# Patient Record
Sex: Male | Born: 1985 | Race: White | Hispanic: No | Marital: Single | Smoking: Never smoker
Health system: Southern US, Community
[De-identification: ages and names within clinical notes are randomized; demographics above are authoritative.]

## PROBLEM LIST (undated history)

## (undated) DIAGNOSIS — E785 Hyperlipidemia, unspecified: Secondary | ICD-10-CM

---
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
## 2010-05-23 ENCOUNTER — Emergency Department: Payer: Self-pay | Admitting: Emergency Medicine

## 2014-11-28 ENCOUNTER — Emergency Department
Admission: EM | Admit: 2014-11-28 | Discharge: 2014-11-28 | Disposition: A | Discharge status: Home / Self Care | Payer: Self-pay | Attending: Emergency Medicine | Admitting: Emergency Medicine

## 2014-11-28 DIAGNOSIS — K625 Hemorrhage of anus and rectum: Secondary | ICD-10-CM | POA: Insufficient documentation

## 2014-11-28 NOTE — ED Provider Notes (Signed)
[facility] Emergency Department Provider Note   ____________________________________________  Time seen: 7 AM I have reviewed the triage vital signs and the triage nursing note.  HISTORY  Chief Complaint Rectal Bleeding   Historian Patient  HPI Evan Dixon is a 29 y.o. male who noticed a small amount of bright red blood after and around a bowel movement just prior to arrival. This never happened before. Symptoms were mild. There is no abdominal pain or cramping. No nausea or vomiting. No black stools.No dizziness or passing out. He does not have a primary care doctor. No weight loss. No night sweats.    No past medical history on file. none  There are no active problems to display for this patient.   No past surgical history on file.  No current outpatient prescriptions on file.  Allergies Codeine  No family history on file.  Social History History  Substance Use Topics  . Smoking status: Not on file  . Smokeless tobacco: Not on file  . Alcohol Use: Not on file    Review of Systems  Constitutional: Negative for fever. Eyes: Negative for visual changes. ENT: Negative for sore throat. Cardiovascular: Negative for chest pain. Respiratory: Negative for shortness of breath. Gastrointestinal: Negative for abdominal pain, vomiting and diarrhea. Genitourinary: Negative for dysuria. Musculoskeletal: Negative for back pain. Skin: Negative for rash. Neurological: Negative for headaches, focal weakness or numbness.  ____________________________________________   PHYSICAL EXAM:  VITAL SIGNS: ED Triage Vitals  Enc Vitals Group     BP 11/28/14 0259 131/76 mmHg     Pulse Rate 11/28/14 0259 60     Resp 11/28/14 0259 20     Temp 11/28/14 0259 98 F (36.7 C)     Temp Source 11/28/14 0259 Oral     SpO2 11/28/14 0259 97 %     Weight 11/28/14 0259 175 lb (79.379 kg)     Height 11/28/14 0259 5\' 8"  (1.727 m)     Head Cir --    Peak Flow --      Pain Score 11/28/14 0552 0     Pain Loc --      Pain Edu? --      Excl. in GC? --      Constitutional: Alert and oriented. Well appearing and in no distress. Eyes: Conjunctivae are normal. PERRL. Normal extraocular movements. ENT   Head: Normocephalic and atraumatic.   Nose: No congestion/rhinnorhea.   Mouth/Throat: Mucous membranes are moist.   Neck: No stridor. Cardiovascular: Normal rate, regular rhythm.  No murmurs, rubs, or gallops. Respiratory: Normal respiratory effort without tachypnea nor retractions. Breath sounds are clear and equal bilaterally. No wheezes/rales/rhonchi. Gastrointestinal: Soft. No distention, no guarding, no rebound. Nontender  Genitourinary/rectal: Patient significantly resisted the rectal exam limiting rectal exam to the very external portion, there are no external hemorrhoids, there is a small amount of bright bloody streaks on rectal exam. Musculoskeletal: Nontender with normal range of motion in all extremities. No joint effusions.  No lower extremity tenderness nor edema. Neurologic:  Normal speech and language. No gross focal neurologic deficits are appreciated. Skin:  Skin is warm, dry and intact. No rash noted. Psychiatric: Mood and affect are normal. Speech and behavior are normal. Patient exhibits appropriate insight and judgment.  ____________________________________________   EKG  None ____________________________________________  LABS (pertinent positives/negatives)  None  ____________________________________________  RADIOLOGY Radiologist results reviewed  None __________________________________________  PROCEDURES  Procedure(s) performed: None Critical Care performed: None  ____________________________________________   ED COURSE /  ASSESSMENT AND PLAN  Pertinent labs & imaging results that were available during my care of the patient were reviewed by me and considered in my medical decision  making (see chart for details).   Patient's exam and evaluation are reassuring, I suspect minor lower GI bleeding due to a fissure or internal hemorrhoid. He does not have any symptoms suggestive of upper GI bleed, or cancer. He is being referred for primary care follow-up at [facility]. Return precautions and discharge instructions were discussed with the patient.   ___________________________________________   FINAL CLINICAL IMPRESSION(S) / ED DIAGNOSES   Final diagnoses:  Rectal bleeding      Governor Rooks, MD 11/28/14 [phone]

## 2014-11-28 NOTE — ED Notes (Signed)
Pt presents to ER and states he had a hard BM and noticed bright red blood in BM.

## 2014-11-28 NOTE — Discharge Instructions (Signed)
Your exam and evaluation are reassuring. I suspect a small amount of bright red blood around the stool was probably due to a fissure, or an internal hemorrhoid. We discussed return to the emergency department for any new or worsening wheezing, abdominal pain, vomiting blood, black stools, fever, dizziness, passing out, or any other symptoms concerning to you. You do need to follow with the primary care doctor and were referred to the [facility] clinic for primary care follow-up and reevaluation for the blood in your stools today. Call for an appointment early this coming week.  Bloody Stools Bloody stools often mean that there is a problem in the digestive tract. Your caregiver may use the term "melena" to describe black, tarry, and bad smelling stools or "hematochezia" to describe red or maroon-colored stools. Blood seen in the stool can be caused by bleeding anywhere along the intestinal tract.  A black stool usually means that blood is coming from the upper part of the gastrointestinal tract (esophagus, stomach, or small bowel). Passing maroon-colored stools or bright red blood usually means that blood is coming from lower down in the large bowel or the rectum. However, sometimes massive bleeding in the stomach or small intestine can cause bright red bloody stools.  Consuming black licorice, lead, iron pills, medicines containing bismuth subsalicylate, or blueberries can also cause black stools. Your caregiver can test black stools to see if blood is present. It is important that the cause of the bleeding be found. Treatment can then be started, and the problem can be corrected. Rectal bleeding may not be serious, but you should not assume everything is okay until you know the cause.It is very important to follow up with your caregiver or a specialist in gastrointestinal problems. CAUSES  Blood in the stools can come from various underlying causes.Often, the cause is not found during your first visit.  Testing is often needed to discover the cause of bleeding in the gastrointestinal tract. Causes range from simple to serious or even life-threatening.Possible causes include:  Hemorrhoids.These are veins that are full of blood (engorged) in the rectum. They cause pain, inflammation, and may bleed.  Anal fissures.These are areas of painful tearing which may bleed. They are often caused by passing hard stool.  Diverticulosis.These are pouches that form on the colon over time, with age, and may bleed significantly.  Diverticulitis.This is inflammation in areas with diverticulosis. It can cause pain, fever, and bloody stools, although bleeding is rare.  Proctitis and colitis. These are inflamed areas of the rectum or colon. They may cause pain, fever, and bloody stools.  Polyps and cancer. Colon cancer is a leading cause of preventable cancer death.It often starts out as precancerous polyps that can be removed during a colonoscopy, preventing progression into cancer. Sometimes, polyps and cancer may cause rectal bleeding.  Gastritis and ulcers.Bleeding from the upper gastrointestinal tract (near the stomach) may travel through the intestines and produce black, sometimes tarry, often bad smelling stools. In certain cases, if the bleeding is fast enough, the stools may not be black, but red and the condition may be life-threatening. SYMPTOMS  You may have stools that are bright red and bloody, that are normal color with blood on them, or that are dark black and tarry. In some cases, you may only have blood in the toilet bowl. Any of these cases need medical care. You may also have:  Pain at the anus or anywhere in the rectum.  Lightheadedness or feeling faint.  Extreme weakness.  Nausea  or vomiting.  Fever. DIAGNOSIS Your caregiver may use the following methods to find the cause of your bleeding:  Taking a medical history. Age is important. Older people tend to develop polyps and  cancer more often. If there is anal pain and a hard, large stool associated with bleeding, a tear of the anus may be the cause. If blood drips into the toilet after a bowel movement, bleeding hemorrhoids may be the problem. The color and frequency of the bleeding are additional considerations. In most cases, the medical history provides clues, but seldom the final answer.  A visual and finger (digital) exam. Your caregiver will inspect the anal area, looking for tears and hemorrhoids. A finger exam can provide information when there is tenderness or a growth inside. In men, the prostate is also examined.  Endoscopy. Several types of small, long scopes (endoscopes) are used to view the colon.  In the office, your caregiver may use a rigid, or more commonly, a flexible viewing sigmoidoscope. This exam is called flexible sigmoidoscopy. It is performed in 5 to 10 minutes.  A more thorough exam is accomplished with a colonoscope. It allows your caregiver to view the entire 5 to 6 foot long colon. Medicine to help you relax (sedative) is usually given for this exam. Frequently, a bleeding lesion may be present beyond the reach of the sigmoidoscope. So, a colonoscopy may be the best exam to start with. Both exams are usually done on an outpatient basis. This means the patient does not stay overnight in the hospital or surgery center.  An upper endoscopy may be needed to examine your stomach. Sedation is used and a flexible endoscope is put in your mouth, down to your stomach.  A barium enema X-ray. This is an X-ray exam. It uses liquid barium inserted by enema into the rectum. This test alone may not identify an actual bleeding point. X-rays highlight abnormal shadows, such as those made by lumps (tumors), diverticuli, or colitis. TREATMENT  Treatment depends on the cause of your bleeding.   For bleeding from the stomach or colon, the caregiver doing your endoscopy or colonoscopy may be able to stop the  bleeding as part of the procedure.  Inflammation or infection of the colon can be treated with medicines.  Many rectal problems can be treated with creams, suppositories, or warm baths.  Surgery is sometimes needed.  Blood transfusions are sometimes needed if you have lost a lot of blood.  For any bleeding problem, let your caregiver know if you take aspirin or other blood thinners regularly. HOME CARE INSTRUCTIONS   Take any medicines exactly as prescribed.  Keep your stools soft by eating a diet high in fiber. Prunes (1 to 3 a day) work well for many people.  Drink enough water and fluids to keep your urine clear or pale yellow.  Take sitz baths if advised. A sitz bath is when you sit in a bathtub with warm water for 10 to 15 minutes to soak, soothe, and cleanse the rectal area.  If enemas or suppositories are advised, be sure you know how to use them. Tell your caregiver if you have problems with this.  Monitor your bowel movements to look for signs of improvement or worsening. SEEK MEDICAL CARE IF:   You do not improve in the time expected.  Your condition worsens after initial improvement.  You develop any new symptoms. SEEK IMMEDIATE MEDICAL CARE IF:   You develop severe or prolonged rectal bleeding.  You  vomit blood.  You feel weak or faint.  You have a fever. MAKE SURE YOU:  Understand these instructions.  Will watch your condition.  Will get help right away if you are not doing well or get worse. Document Released: 05/25/2002 Document Revised: 08/27/2011 Document Reviewed: 10/20/2010 [facility] Patient Information 2015 [place], [place]. This information is not intended to replace advice given to you by your health care provider. Make sure you discuss any questions you have with your health care provider.

## 2015-04-07 ENCOUNTER — Encounter: Payer: Self-pay | Admitting: Emergency Medicine

## 2015-04-07 ENCOUNTER — Emergency Department
Admission: EM | Admit: 2015-04-07 | Discharge: 2015-04-07 | Disposition: A | Discharge status: Home / Self Care | Payer: Worker's Compensation | Attending: Emergency Medicine | Admitting: Emergency Medicine

## 2015-04-07 DIAGNOSIS — Y9389 Activity, other specified: Secondary | ICD-10-CM | POA: Insufficient documentation

## 2015-04-07 DIAGNOSIS — M25561 Pain in right knee: Secondary | ICD-10-CM

## 2015-04-07 DIAGNOSIS — Y9289 Other specified places as the place of occurrence of the external cause: Secondary | ICD-10-CM | POA: Insufficient documentation

## 2015-04-07 DIAGNOSIS — Y990 Civilian activity done for income or pay: Secondary | ICD-10-CM | POA: Diagnosis not present

## 2015-04-07 DIAGNOSIS — X58XXXA Exposure to other specified factors, initial encounter: Secondary | ICD-10-CM | POA: Diagnosis not present

## 2015-04-07 DIAGNOSIS — S8991XA Unspecified injury of right lower leg, initial encounter: Secondary | ICD-10-CM | POA: Insufficient documentation

## 2015-04-07 NOTE — ED Notes (Signed)
Pt to ed with c/o right knee pain.  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  Reports he felt a "pop" but no pain until later that night.  Pt reports increased swelling in right knee.

## 2015-04-07 NOTE — ED Provider Notes (Signed)
[facility] Emergency Department Provider Note    ____________________________________________  Time seen: 1515  I have reviewed the triage vital signs and the nursing notes.   HISTORY  Chief Complaint Knee Pain   History limited by: Not Limited   HPI Evan Dixon is a 29 y.o. male who presents to the emergency department today because of concerns for right knee pain. He states that 2 days ago he was at work moving boxes when he heard a pop in his knee. He states he did not have any sudden onset of pain. He states that later that night however he started feeling some discomfort in the knee. He describes being located underneath the proximal patella. He states the pain has then gradually gotten a little worse. He also noticed some swelling to his knee. He was able to go for a run on his knee this morning. He denies any previous injuries to that knee. Denies any numbness or tingling distally.   History reviewed. No pertinent past medical history.  There are no active problems to display for this patient.   History reviewed. No pertinent past surgical history.  No current outpatient prescriptions on file.  Allergies Codeine  History reviewed. No pertinent family history.  Social History Social History  Substance Use Topics  . Smoking status: Never Smoker   . Smokeless tobacco: None  . Alcohol Use: No    Review of Systems  Constitutional: Negative for fever. Cardiovascular: Negative for chest pain. Respiratory: Negative for shortness of breath. Gastrointestinal: Negative for abdominal pain, vomiting and diarrhea. Genitourinary: Negative for dysuria. Musculoskeletal: Negative for back pain. Positive for right knee pain. Skin: Negative for rash. Neurological: Negative for headaches, focal weakness or numbness.   10-point ROS otherwise negative.  ____________________________________________   PHYSICAL EXAM:  VITAL SIGNS: ED  Triage Vitals  Enc Vitals Group     BP 04/07/15 1443 140/83 mmHg     Pulse Rate 04/07/15 1443 60     Resp 04/07/15 1443 18     Temp 04/07/15 1443 98.5 F (36.9 C)     Temp Source 04/07/15 1443 Oral     SpO2 04/07/15 1443 100 %     Weight 04/07/15 1432 200 lb (90.719 kg)     Height 04/07/15 1432  (1.727 m)     Head Cir --      Peak Flow --      Pain Score 04/07/15 1432 4   Constitutional: Alert and oriented. Well appearing and in no distress. Eyes: Conjunctivae are normal. PERRL. Normal extraocular movements. ENT   Head: Normocephalic and atraumatic.   Nose: No congestion/rhinnorhea.   Mouth/Throat: Mucous membranes are moist.   Neck: No stridor. Cardiovascular: Normal rate, regular rhythm.   Respiratory: Normal respiratory effort without tachypnea nor retractions. Musculoskeletal: No laxity of the right knee appreciated. No effusions appreciated. No point tenderness. Patient did have some pain elicited during valgus testing. Neurovascularly intact distally. Neurologic:  Normal speech and language. No gross focal neurologic deficits are appreciated. Speech is normal.  Skin:  Skin is warm, dry and intact. No rash noted. Psychiatric: Mood and affect are normal. Speech and behavior are normal. Patient exhibits appropriate insight and judgment.  ____________________________________________    LABS (pertinent positives/negatives)  None  ____________________________________________   EKG  None  ____________________________________________    RADIOLOGY  None   ____________________________________________   PROCEDURES  Procedure(s) performed: None  Critical Care performed: No  ____________________________________________   INITIAL IMPRESSION / ASSESSMENT AND  PLAN / ED COURSE  Pertinent labs & imaging results that were available during my care of the patient were reviewed by me and considered in my medical decision making (see chart for  details).  Patient presented to the emergency department today because of concerns for right knee pain. On exam no laxity appreciated on valgus or varus testing or anterior posterior drawer test. Additionally no appreciable swelling or effusion. Neurovascularly intact distally. Only some mild tenderness during valgus testing. I do not think patient requires an x-ray at this time especially given the fact that he was able to go for a run this morning. I think patient might have a mild sprain of the knee. I will give him orthopedic follow-up. I did discuss rice with patient.  ____________________________________________   FINAL CLINICAL IMPRESSION(S) / ED DIAGNOSES  Final diagnoses:  Right knee pain     Phineas SemenGraydon Jennife Zaucha, MD 04/07/15 [phone]

## 2015-04-07 NOTE — ED Notes (Signed)
Spoke to [employer] engineer[employer] Staff, Mr. Lovell SheehanJenkins and was instructed not to perform a urine drug screen.

## 2015-04-07 NOTE — Discharge Instructions (Signed)
Apply a compressive ACE bandage. Rest and elevate the affected painful area.  Apply cold compresses intermittently as needed.  As pain recedes, begin normal activities slowly as tolerated.  Call orthopedics if symptoms persist.    Knee Pain Knee pain is a very common symptom and can have many causes. Knee pain often goes away when you follow your health care provider's instructions for relieving pain and discomfort at home. However, knee pain can develop into a condition that needs treatment. Some conditions may include:  Arthritis caused by wear and tear (osteoarthritis).  Arthritis caused by swelling and irritation (rheumatoid arthritis or gout).  A cyst or growth in your knee.  An infection in your knee joint.  An injury that will not heal.  Damage, swelling, or irritation of the tissues that support your knee (torn ligaments or tendinitis). If your knee pain continues, additional tests may be ordered to diagnose your condition. Tests may include X-rays or other imaging studies of your knee. You may also need to have fluid removed from your knee. Treatment for ongoing knee pain depends on the cause, but treatment may include:  Medicines to relieve pain or swelling.  Steroid injections in your knee.  Physical therapy.  Surgery. HOME CARE INSTRUCTIONS  Take medicines only as directed by your health care provider.  Rest your knee and keep it raised (elevated) while you are resting.  Do not do things that cause or worsen pain.  Avoid high-impact activities or exercises, such as running, jumping rope, or doing jumping jacks.  Apply ice to the knee area:  Put ice in a plastic bag.  Place a towel between your skin and the bag.  Leave the ice on for 20 minutes, 2-3 times a day.  Ask your health care provider if you should wear an elastic knee support.  Keep a pillow under your knee when you sleep.  Lose weight if you are overweight. Extra weight can put pressure on your  knee.  Do not use any tobacco products, including cigarettes, chewing tobacco, or electronic cigarettes. If you need help quitting, ask your health care provider. Smoking may slow the healing of any bone and joint problems that you may have. SEEK MEDICAL CARE IF:  Your knee pain continues, changes, or gets worse.  You have a fever along with knee pain.  Your knee buckles or locks up.  Your knee becomes more swollen. SEEK IMMEDIATE MEDICAL CARE IF:   Your knee joint feels hot to the touch.  You have chest pain or trouble breathing.   This information is not intended to replace advice given to you by your health care provider. Make sure you discuss any questions you have with your health care provider.   Document Released: 04/01/2007 Document Revised: 06/25/2014 Document Reviewed: 01/18/2014 Elsevier Interactive Patient Education [employer]2016 [employer] Inc.

## 2017-05-23 ENCOUNTER — Other Ambulatory Visit: Payer: Self-pay | Admitting: Nurse Practitioner

## 2017-05-23 DIAGNOSIS — R1011 Right upper quadrant pain: Secondary | ICD-10-CM

## 2017-06-07 ENCOUNTER — Encounter
Admission: RE | Admit: 2017-06-07 | Discharge: 2017-06-07 | Disposition: A | Discharge status: Home / Self Care | Payer: BLUE CROSS/BLUE SHIELD | Source: Ambulatory Visit | Attending: Nurse Practitioner | Admitting: Nurse Practitioner

## 2017-06-07 DIAGNOSIS — R1011 Right upper quadrant pain: Secondary | ICD-10-CM | POA: Insufficient documentation

## 2017-06-07 MED ORDER — TECHNETIUM TC 99M MEBROFENIN IV KIT
5.0000 | PACK | Freq: Once | INTRAVENOUS | Status: AC | PRN
  Administered 2017-06-07: 5.52 via INTRAVENOUS

## 2018-07-09 IMAGING — NM NM HEPATO W/GB/PHARM/[PERSON_NAME]
2 series · 12 of 12 positions shown · non-contrast
Comparison: None.

CLINICAL DATA: Right upper quadrant pain for several months. Worse
pain with fatty meal.

EXAM:
NUCLEAR MEDICINE HEPATOBILIARY IMAGING WITH GALLBLADDER EF
TECHNIQUE: Sequential images of the abdomen were obtained [DATE] minutes
following intravenous administration of radiopharmaceutical. After
oral ingestion of Ensure, gallbladder ejection fraction was
determined.
RADIOPHARMACEUTICALS:  5.52 mCi Kc-EEm Choletec IV

[Series 1000: hepatobiliary scan · 9.59mm/px · 6 of 60 frames shown]
[frame 6/60]
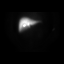
[frame 16/60]
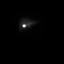
[frame 26/60]
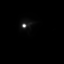
[frame 36/60]
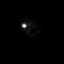
[frame 46/60]
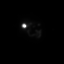
[frame 56/60]
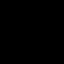

[Series 1000: gallbladder ef · 4.80mm/px · 6 of 120 frames shown]
[frame 11/120]
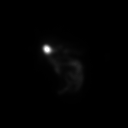
[frame 31/120]
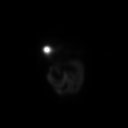
[frame 51/120]
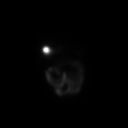
[frame 71/120]
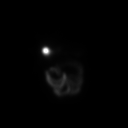
[frame 91/120]
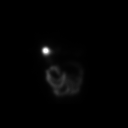
[frame 111/120]
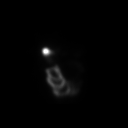

[12 of 12 positions shown; findings below may reference images not displayed]

FINDINGS: Gallbladder activity is visualized, consistent with patency of
cystic duct. Biliary activity passes into small bowel, consistent
with patent common bile duct.

Calculated gallbladder ejection fraction is 42%. (Normal gallbladder
ejection fraction with half-and-half is greater than 33%.)

The patient experienced [DATE] pain with administration of
Ensure. The study is degraded by patient motion artifact.
IMPRESSION: 1. Normal gallbladder activity and ejection fraction.
2. Mild pain with administration of Ensure.

## 2022-03-08 DIAGNOSIS — R109 Unspecified abdominal pain: Secondary | ICD-10-CM | POA: Diagnosis not present

## 2022-03-08 DIAGNOSIS — E785 Hyperlipidemia, unspecified: Secondary | ICD-10-CM | POA: Diagnosis not present

## 2022-03-08 DIAGNOSIS — R1010 Upper abdominal pain, unspecified: Secondary | ICD-10-CM | POA: Diagnosis not present

## 2022-03-08 DIAGNOSIS — R5383 Other fatigue: Secondary | ICD-10-CM | POA: Diagnosis not present

## 2022-03-08 DIAGNOSIS — R110 Nausea: Secondary | ICD-10-CM | POA: Diagnosis not present

## 2022-03-14 DIAGNOSIS — R1010 Upper abdominal pain, unspecified: Secondary | ICD-10-CM | POA: Diagnosis not present

## 2022-03-30 DIAGNOSIS — K5730 Diverticulosis of large intestine without perforation or abscess without bleeding: Secondary | ICD-10-CM | POA: Diagnosis not present

## 2022-03-30 DIAGNOSIS — E785 Hyperlipidemia, unspecified: Secondary | ICD-10-CM | POA: Diagnosis not present

## 2022-03-30 DIAGNOSIS — R109 Unspecified abdominal pain: Secondary | ICD-10-CM | POA: Diagnosis not present

## 2022-03-30 DIAGNOSIS — R110 Nausea: Secondary | ICD-10-CM | POA: Diagnosis not present

## 2022-06-04 ENCOUNTER — Ambulatory Visit: Payer: BLUE CROSS/BLUE SHIELD | Admitting: Dietician

## 2022-08-13 ENCOUNTER — Encounter: Payer: Self-pay | Admitting: Gastroenterology

## 2022-08-13 ENCOUNTER — Ambulatory Visit: Payer: 59 | Admitting: Gastroenterology

## 2022-09-16 DIAGNOSIS — R569 Unspecified convulsions: Secondary | ICD-10-CM | POA: Diagnosis not present

## 2022-09-16 DIAGNOSIS — R197 Diarrhea, unspecified: Secondary | ICD-10-CM | POA: Diagnosis not present

## 2022-09-16 DIAGNOSIS — R4182 Altered mental status, unspecified: Secondary | ICD-10-CM | POA: Diagnosis not present

## 2022-09-16 DIAGNOSIS — A419 Sepsis, unspecified organism: Secondary | ICD-10-CM | POA: Diagnosis not present

## 2022-09-16 DIAGNOSIS — R0989 Other specified symptoms and signs involving the circulatory and respiratory systems: Secondary | ICD-10-CM | POA: Diagnosis not present

## 2022-09-16 DIAGNOSIS — E785 Hyperlipidemia, unspecified: Secondary | ICD-10-CM | POA: Diagnosis not present

## 2022-09-16 DIAGNOSIS — R109 Unspecified abdominal pain: Secondary | ICD-10-CM | POA: Diagnosis not present

## 2022-09-16 DIAGNOSIS — R112 Nausea with vomiting, unspecified: Secondary | ICD-10-CM | POA: Diagnosis not present

## 2022-09-16 DIAGNOSIS — R079 Chest pain, unspecified: Secondary | ICD-10-CM | POA: Diagnosis not present

## 2022-09-17 DIAGNOSIS — E785 Hyperlipidemia, unspecified: Secondary | ICD-10-CM | POA: Diagnosis not present

## 2022-09-17 DIAGNOSIS — A0811 Acute gastroenteropathy due to Norwalk agent: Secondary | ICD-10-CM | POA: Diagnosis not present

## 2022-09-17 DIAGNOSIS — R519 Headache, unspecified: Secondary | ICD-10-CM | POA: Diagnosis not present

## 2022-09-17 DIAGNOSIS — A419 Sepsis, unspecified organism: Secondary | ICD-10-CM | POA: Diagnosis not present

## 2022-09-18 DIAGNOSIS — A0811 Acute gastroenteropathy due to Norwalk agent: Secondary | ICD-10-CM | POA: Diagnosis not present

## 2022-09-18 DIAGNOSIS — E785 Hyperlipidemia, unspecified: Secondary | ICD-10-CM | POA: Diagnosis not present

## 2022-09-18 DIAGNOSIS — R4182 Altered mental status, unspecified: Secondary | ICD-10-CM | POA: Diagnosis not present

## 2022-09-18 DIAGNOSIS — R519 Headache, unspecified: Secondary | ICD-10-CM | POA: Diagnosis not present

## 2022-09-18 DIAGNOSIS — R6510 Systemic inflammatory response syndrome (SIRS) of non-infectious origin without acute organ dysfunction: Secondary | ICD-10-CM | POA: Diagnosis not present

## 2022-10-19 DIAGNOSIS — L0390 Cellulitis, unspecified: Secondary | ICD-10-CM | POA: Diagnosis not present

## 2022-10-19 DIAGNOSIS — W57XXXA Bitten or stung by nonvenomous insect and other nonvenomous arthropods, initial encounter: Secondary | ICD-10-CM | POA: Diagnosis not present

## 2022-10-19 DIAGNOSIS — L0292 Furuncle, unspecified: Secondary | ICD-10-CM | POA: Diagnosis not present

## 2022-12-25 ENCOUNTER — Ambulatory Visit: Payer: 59 | Admitting: Internal Medicine

## 2022-12-25 VITALS — BP 110/68 | HR 76 | Ht 70.0 in | Wt 216.0 lb

## 2022-12-25 DIAGNOSIS — E669 Obesity, unspecified: Secondary | ICD-10-CM | POA: Diagnosis not present

## 2022-12-25 DIAGNOSIS — E782 Mixed hyperlipidemia: Secondary | ICD-10-CM | POA: Diagnosis not present

## 2022-12-25 DIAGNOSIS — R1011 Right upper quadrant pain: Secondary | ICD-10-CM | POA: Insufficient documentation

## 2022-12-25 MED ORDER — ATORVASTATIN CALCIUM 10 MG PO TABS: 10.0000 mg | ORAL_TABLET | Freq: Every evening | ORAL | 0 refills | Status: DC

## 2022-12-25 NOTE — Progress Notes (Signed)
Established Patient Office Visit  Subjective:  Patient ID: Evan Dixon, male    DOB: 1985-10-16  Age: 37 y.o. MRN: [id number]  Chief Complaint  Patient presents with   Follow-up    Follow Up/ ABD Pain    No new complaints, here to discuss his cholesterol and ruq pain. Was referred to GI but failed to correspond with them. Admits to dietary indiscretion since last visit and now willing to start statins.     No other concerns at this time.   No past medical history on file.  No past surgical history on file.  Social History   Socioeconomic History   Marital status: Single    Spouse name: Not on file   Number of children: Not on file   Years of education: Not on file   Highest education level: Not on file  Occupational History   Not on file  Tobacco Use   Smoking status: Never   Smokeless tobacco: Not on file  Substance and Sexual Activity   Alcohol use: No   Drug use: No   Sexual activity: Not on file  Other Topics Concern   Not on file  Social History Narrative   Not on file   Social Determinants of Health   Financial Resource Strain: Not on file  Food Insecurity: Not on file  Transportation Needs: Not on file  Physical Activity: Not on file  Stress: Not on file  Social Connections: Not on file  Intimate Partner Violence: Not on file    No family history on file.  Allergies  Allergen Reactions   Codeine Shortness Of Breath    Review of Systems  Constitutional:  Positive for weight loss.  HENT: Negative.    Eyes: Negative.   Respiratory: Negative.    Cardiovascular: Negative.   Gastrointestinal: Negative.   Genitourinary: Negative.   Skin: Negative.   Neurological: Negative.   Endo/Heme/Allergies: Negative.        Objective:   BP 110/68   Pulse 76   Ht 5\' 10"  (1.778 m)   Wt 216 lb (98 kg)   SpO2 97%   BMI 30.99 kg/m   Vitals:   12/25/22 1329  BP: 110/68  Pulse: 76  Height: 5\' 10"  (1.778 m)  Weight: 216 lb (98 kg)   SpO2: 97%  BMI (Calculated): 30.99    Physical Exam Vitals reviewed.  Constitutional:      Appearance: Normal appearance.  HENT:     Head: Normocephalic.     Left Ear: There is no impacted cerumen.     Nose: Nose normal.     Mouth/Throat:     Mouth: Mucous membranes are moist.     Pharynx: No posterior oropharyngeal erythema.  Eyes:     Extraocular Movements: Extraocular movements intact.     Pupils: Pupils are equal, round, and reactive to light.  Cardiovascular:     Rate and Rhythm: Regular rhythm.     Chest Wall: PMI is not displaced.     Pulses: Normal pulses.     Heart sounds: Normal heart sounds. No murmur heard. Pulmonary:     Effort: Pulmonary effort is normal.     Breath sounds: Normal air entry. No rhonchi or rales.  Abdominal:     General: Abdomen is flat. Bowel sounds are normal. There is no distension.     Palpations: Abdomen is soft. There is no hepatomegaly, splenomegaly or mass.     Tenderness: There is no abdominal tenderness.  Musculoskeletal:  General: Normal range of motion.     Cervical back: Normal range of motion and neck supple.     Right lower leg: No edema.     Left lower leg: No edema.  Skin:    General: Skin is warm and dry.  Neurological:     General: No focal deficit present.     Mental Status: He is alert and oriented to person, place, and time.     Cranial Nerves: No cranial nerve deficit.     Motor: No weakness.  Psychiatric:        Mood and Affect: Mood normal.        Behavior: Behavior normal.      No results found for any visits on 12/25/22.  No results found for this or any previous visit (from the past 2160 hour([name])).    Assessment & Plan:  As per problem list. Problem List Items Addressed This Visit       Other   Mixed hyperlipidemia - Primary   Relevant Medications   atorvastatin (LIPITOR) 10 MG tablet   Other Relevant Orders   Lipid panel   CK   Hepatic function panel   Continuous RUQ abdominal pain    Relevant Orders   Ambulatory referral to Gastroenterology    Return in about 3 months (around 03/27/2023) for fu with labs prior-[name].   Total time spent: 20 minutes  Luna Fuse, MD  12/25/2022   This document may have been prepared by [facility] Voice Recognition software and as such may include unintentional dictation errors.

## 2023-03-29 ENCOUNTER — Encounter: Payer: Self-pay | Admitting: Cardiology

## 2023-03-29 ENCOUNTER — Ambulatory Visit: Payer: 59 | Admitting: Cardiology

## 2023-03-29 VITALS — BP 118/80 | HR 87 | Ht 70.0 in | Wt 214.8 lb

## 2023-03-29 DIAGNOSIS — E782 Mixed hyperlipidemia: Secondary | ICD-10-CM

## 2023-03-29 DIAGNOSIS — E669 Obesity, unspecified: Secondary | ICD-10-CM | POA: Diagnosis not present

## 2023-03-29 DIAGNOSIS — Z131 Encounter for screening for diabetes mellitus: Secondary | ICD-10-CM

## 2023-03-29 MED ORDER — ATORVASTATIN CALCIUM 10 MG PO TABS
10.0000 mg | ORAL_TABLET | Freq: Every evening | ORAL | 0 refills | Status: DC
Start: 2023-03-29 — End: 2023-07-29

## 2023-03-29 NOTE — Progress Notes (Signed)
Established Patient Office Visit  Subjective:  Patient ID: Evan Dixon, male    DOB: 1985/07/17  Age: 37 y.o. MRN: [id number]  Chief Complaint  Patient presents with   Follow-up    3 mo lab results    Patient in office for 3 month follow up and to discuss blood work that he did not have done. Patient admits to not being compliant with atorvastatin. Sent in a refill. Will return in 3-4 weeks for fasting lab work. Patient reports feeling well. No complaints. States he played disc golf this morning for approximately 4 hours.     No other concerns at this time.   History reviewed. No pertinent past medical history.  History reviewed. No pertinent surgical history.  Social History   Socioeconomic History   Marital status: Single    Spouse name: Not on file   Number of children: Not on file   Years of education: Not on file   Highest education level: Not on file  Occupational History   Not on file  Tobacco Use   Smoking status: Never   Smokeless tobacco: Not on file  Substance and Sexual Activity   Alcohol use: No   Drug use: No   Sexual activity: Not on file  Other Topics Concern   Not on file  Social History Narrative   Not on file   Social Determinants of Health   Financial Resource Strain: Low Risk  (09/17/2022)   Received from [place] and [place], [place] and Affiliates   Overall [employer] (CARDIA)    Difficulty of Paying Living Expenses: Not hard at all  Food Insecurity: No Food Insecurity (09/17/2022)   Received from [place] and [place], [place] and [employer] Vital Sign    Worried About Programme researcher, broadcasting/film/video in the Last Year: Never true    Ran Out of Food in the Last Year: Never true  Transportation Needs: No Transportation Needs (09/17/2022)   Received from [place] and [place], [place] and [employer] - Administrator, Civil Service (Medical): No    Lack of Transportation (Non-Medical): No   Physical Activity: Not on file  Stress: Not on file  Social Connections: Unknown (10/27/2021)   Received from [facility], [facility]   Social Network    Social Network: Not on file  Intimate Partner Violence: Not At Risk (09/17/2022)   Received from [place] and [place], [place] and [employer], Afraid, Rape, and Kick questionnaire    Fear of Current or Ex-Partner: No    Emotionally Abused: No    Physically Abused: No    Sexually Abused: No    History reviewed. No pertinent family history.  Allergies  Allergen Reactions   Codeine Shortness Of Breath    Review of Systems  Constitutional: Negative.   HENT: Negative.    Eyes: Negative.   Respiratory: Negative.  Negative for shortness of breath.   Cardiovascular: Negative.  Negative for chest pain.  Gastrointestinal: Negative.  Negative for abdominal pain, constipation and diarrhea.  Genitourinary: Negative.   Musculoskeletal:  Negative for joint pain and myalgias.  Skin: Negative.   Neurological: Negative.  Negative for dizziness and headaches.  Endo/Heme/Allergies: Negative.   All other systems reviewed and are negative.      Objective:   BP 118/80 (BP Location: Right Arm, Patient Position: Sitting, Cuff Size: Large)   Pulse 87   Ht 5\' 10"  (1.778 m)   Wt 214 lb 12.8  oz (97.4 kg)   SpO2 98%   BMI 30.82 kg/m   Vitals:   03/29/23 1457 03/29/23 1508  BP: (!) 130/95 118/80  Pulse: 87   Height: 5\' 10"  (1.778 m)   Weight: 214 lb 12.8 oz (97.4 kg)   SpO2: 98%   BMI (Calculated): 30.82     Physical Exam Nursing note reviewed.  Constitutional:      Appearance: Normal appearance. He is normal weight.  HENT:     Head: Normocephalic and atraumatic.     Nose: Nose normal.     Mouth/Throat:     Mouth: Mucous membranes are moist.     Pharynx: Oropharynx is clear.  Eyes:     Extraocular Movements: Extraocular movements intact.     Conjunctiva/sclera: Conjunctivae normal.     Pupils: Pupils  are equal, round, and reactive to light.  Cardiovascular:     Rate and Rhythm: Normal rate and regular rhythm.     Pulses: Normal pulses.     Heart sounds: Normal heart sounds.  Pulmonary:     Effort: Pulmonary effort is normal.     Breath sounds: Normal breath sounds.  Abdominal:     General: Abdomen is flat. Bowel sounds are normal.     Palpations: Abdomen is soft.  Musculoskeletal:        General: Normal range of motion.     Cervical back: Normal range of motion.  Skin:    General: Skin is warm and dry.  Neurological:     General: No focal deficit present.     Mental Status: He is alert and oriented to person, place, and time.  Psychiatric:        Mood and Affect: Mood normal.        Behavior: Behavior normal.        Thought Content: Thought content normal.        Judgment: Judgment normal.      No results found for any visits on 03/29/23.  No results found for this or any previous visit (from the past 2160 hour(s)).    Assessment & Plan:  Return for fasting lab work.  Continue atorvastatin 10 mg daily pending lab results.   Problem List Items Addressed This Visit       Other   Mixed hyperlipidemia - Primary   Relevant Medications   atorvastatin (LIPITOR) 10 MG tablet   Other Relevant Orders   CMP14+EGFR   Obesity (BMI 30-39.9)   Other Visit Diagnoses     Diabetes mellitus screening       Relevant Orders   Hemoglobin A1c       Return in about 4 months (around 07/30/2023).   Total time spent: 25 minutes  Google, NP  03/29/2023   This document may have been prepared by Dragon Voice Recognition software and as such may include unintentional dictation errors.

## 2023-06-17 ENCOUNTER — Other Ambulatory Visit: Payer: Self-pay

## 2023-06-17 ENCOUNTER — Encounter: Payer: Self-pay | Admitting: Emergency Medicine

## 2023-06-17 ENCOUNTER — Emergency Department: Payer: 59

## 2023-06-17 ENCOUNTER — Emergency Department
Admission: EM | Admit: 2023-06-17 | Discharge: 2023-06-17 | Disposition: A | Discharge status: Home / Self Care | Payer: 59 | Attending: Emergency Medicine | Admitting: Emergency Medicine

## 2023-06-17 DIAGNOSIS — M545 Low back pain, unspecified: Secondary | ICD-10-CM | POA: Insufficient documentation

## 2023-06-17 DIAGNOSIS — R109 Unspecified abdominal pain: Secondary | ICD-10-CM | POA: Diagnosis not present

## 2023-06-17 DIAGNOSIS — M5459 Other low back pain: Secondary | ICD-10-CM | POA: Diagnosis not present

## 2023-06-17 HISTORY — DX: Hyperlipidemia, unspecified: E78.5

## 2023-06-17 LAB — URINALYSIS, ROUTINE W REFLEX MICROSCOPIC
Bilirubin Urine: NEGATIVE
Glucose, UA: NEGATIVE mg/dL
Ketones, ur: NEGATIVE mg/dL
Leukocytes,Ua: NEGATIVE
Nitrite: NEGATIVE
Protein, ur: NEGATIVE mg/dL
Specific Gravity, Urine: 1.017 (ref 1.005–1.030)
pH: 5 (ref 5.0–8.0)

## 2023-06-17 LAB — BASIC METABOLIC PANEL
Anion gap: 11 (ref 5–15)
BUN: 13 mg/dL (ref 6–20)
CO2: 22 mmol/L (ref 22–32)
Calcium: 8.9 mg/dL (ref 8.9–10.3)
Chloride: 102 mmol/L (ref 98–111)
Creatinine, Ser: 1.07 mg/dL (ref 0.61–1.24)
GFR, Estimated: >60 mL/min (ref >60)
Glucose, Bld: 113 mg/dL — ABNORMAL HIGH (ref 70–99)
Potassium: 3.8 mmol/L (ref 3.5–5.1)
Sodium: 135 mmol/L (ref 135–145)

## 2023-06-17 LAB — CBC
HCT: 45.3 % (ref 39.0–52.0)
Hemoglobin: 15.1 g/dL (ref 13.0–17.0)
MCH: 28.4 pg (ref 26.0–34.0)
MCHC: 33.3 g/dL (ref 30.0–36.0)
MCV: 85.3 fL (ref 80.0–100.0)
Platelets: 290 10*3/uL (ref 150–400)
RBC: 5.31 MIL/uL (ref 4.22–5.81)
RDW: 13.3 % (ref 11.5–15.5)
WBC: 7.2 10*3/uL (ref 4.0–10.5)
nRBC: 0 % (ref 0.0–0.2)

## 2023-06-17 MED ORDER — KETOROLAC TROMETHAMINE 30 MG/ML IJ SOLN
30.0000 mg | Freq: Once | INTRAMUSCULAR | Status: AC
  Administered 2023-06-17: 30 mg via INTRAVENOUS
  Filled 2023-06-17: qty 1

## 2023-06-17 MED ORDER — NAPROXEN 500 MG PO TABS: 500.0000 mg | ORAL_TABLET | Freq: Two times a day (BID) | ORAL | 2 refills | Status: AC

## 2023-06-17 MED ORDER — METHOCARBAMOL 500 MG PO TABS: 500.0000 mg | ORAL_TABLET | Freq: Three times a day (TID) | ORAL | 1 refills | Status: DC | PRN

## 2023-06-17 MED ORDER — TRAMADOL HCL 50 MG PO TABS: 50.0000 mg | ORAL_TABLET | Freq: Four times a day (QID) | ORAL | 0 refills | Status: AC | PRN

## 2023-06-17 MED ORDER — PREDNISONE 50 MG PO TABS: 50.0000 mg | ORAL_TABLET | Freq: Every day | ORAL | 0 refills | Status: AC

## 2023-06-17 MED ORDER — OXYCODONE-ACETAMINOPHEN 5-325 MG PO TABS: 1.0000 | ORAL_TABLET | ORAL | Status: DC | PRN

## 2023-06-17 NOTE — ED Provider Notes (Signed)
[facility] Provider Note    Event Date/Time   First MD Initiated Contact with Patient 06/17/23 [phone]     (approximate)   History   Flank Pain   HPI  Evan Dixon is a 37 y.o. male who presents with complaints of left back and left flank pain which he reports was bothering him a little bit yesterday but became more severe around 3:30 in the morning.  Denies a history of kidney stones.  Also reports some discomfort in his left back over the last several weeks per his partner.  No fevers or chills, no dysuria     Physical Exam   Triage Vital Signs: ED Triage Vitals  Encounter Vitals Group     BP 06/17/23 0435 (!) 158/104     Systolic BP Percentile --      Diastolic BP Percentile --      Pulse Rate 06/17/23 0435 62     Resp 06/17/23 0435 18     Temp 06/17/23 0435 97.7 F (36.5 C)     Temp Source 06/17/23 0435 Oral     SpO2 06/17/23 0435 98 %     Weight 06/17/23 0432 102.1 kg (225 lb)     Height 06/17/23 0432 1.778 m (5\' 10" )     Head Circumference --      Peak Flow --      Pain Score 06/17/23 0432 8     Pain Loc --      Pain Education --      Exclude from Growth Chart --     Most recent vital signs: Vitals:   06/17/23 0435 06/17/23 0856  BP: (!) 158/104 134/78  Pulse: 62 60  Resp: 18 14  Temp: 97.7 F (36.5 C)   SpO2: 98% 100%     General: Awake, no distress.  CV:  Good peripheral perfusion.  Resp:  Normal effort.  Abd:  No distention.  Soft, nontender, no CVA tenderness Other:     ED Results / Procedures / Treatments   Labs (all labs ordered are listed, but only abnormal results are displayed) Labs Reviewed  URINALYSIS, ROUTINE W REFLEX MICROSCOPIC - Abnormal; Notable for the following components:      Result Value   Color, Urine YELLOW (*)    APPearance CLEAR (*)    Hgb urine dipstick MODERATE (*)    Bacteria, UA RARE (*)    All other components within normal limits  BASIC METABOLIC PANEL - Abnormal; Notable  for the following components:   Glucose, Bld 113 (*)    All other components within normal limits  CBC     EKG     RADIOLOGY CT renal stone study viewed interpret by me, no ureterolithiasis, pending radiology confirmation    PROCEDURES:  Critical Care performed:   Procedures   MEDICATIONS ORDERED IN ED: Medications  ketorolac (TORADOL) 30 MG/ML injection 30 mg (30 mg Intravenous Given 06/17/23 0806)     IMPRESSION / MDM / ASSESSMENT AND PLAN / ED COURSE  I reviewed the triage vital signs and the nursing notes. Patient's presentation is most consistent with acute presentation with potential threat to life or bodily function.  Patient presents with back and flank pain as detailed above, differential includes ureterolithiasis, UTI, diverticulitis, musculoskeletal pain  Urinalysis does demonstrate hemoglobin, he reports this is chronic for him, will send for CT renal stone study to rule out ureterolithiasis, will treat with IV Toradol, lab work otherwise reassuring, pending CT  CT scan without evidence of ureterolithiasis  Suspect musculoskeletal pain, patient feeling much better after Toradol, will treat with NSAIDs, prednisone, outpatient follow-up patient and spouse agree with this plan      FINAL CLINICAL IMPRESSION(S) / ED DIAGNOSES   Final diagnoses:  Acute left-sided low back pain without sciatica     Rx / DC Orders   ED Discharge Orders          Ordered    traMADol (ULTRAM) 50 MG tablet  Every 6 hours PRN        06/17/23 0855    naproxen (NAPROSYN) 500 MG tablet  2 times daily with meals        06/17/23 0855    methocarbamol (ROBAXIN) 500 MG tablet  Every 8 hours PRN        06/17/23 0855    predniSONE (DELTASONE) 50 MG tablet  Daily with breakfast        06/17/23 0855             Note:  This document was prepared using Dragon voice recognition software and may include unintentional dictation errors.   Jene Every, MD 06/17/23 [phone]

## 2023-06-17 NOTE — ED Triage Notes (Signed)
Pt to ED from home c/o left flank pain starting around midnight, diarrhea x3, denies n/v, denies urinary changes.  Pain is constant with intermittent severe spasms.  Denies known injury and states does not feel like a pulled muscle.  Pt A&Ox4, chest rise even and unlabored, skin WNL and in NAD at this time.

## 2023-06-26 DIAGNOSIS — M545 Low back pain, unspecified: Secondary | ICD-10-CM | POA: Diagnosis not present

## 2023-06-26 DIAGNOSIS — S32048A Other fracture of fourth lumbar vertebra, initial encounter for closed fracture: Secondary | ICD-10-CM | POA: Diagnosis not present

## 2023-06-26 DIAGNOSIS — S239XXA Sprain of unspecified parts of thorax, initial encounter: Secondary | ICD-10-CM | POA: Diagnosis not present

## 2023-06-26 DIAGNOSIS — M5134 Other intervertebral disc degeneration, thoracic region: Secondary | ICD-10-CM | POA: Diagnosis not present

## 2023-06-26 DIAGNOSIS — M4807 Spinal stenosis, lumbosacral region: Secondary | ICD-10-CM | POA: Diagnosis not present

## 2023-06-27 ENCOUNTER — Encounter: Payer: Self-pay | Admitting: Orthopedic Surgery

## 2023-06-27 ENCOUNTER — Other Ambulatory Visit: Payer: Self-pay | Admitting: Orthopedic Surgery

## 2023-06-27 DIAGNOSIS — M4807 Spinal stenosis, lumbosacral region: Secondary | ICD-10-CM

## 2023-07-03 ENCOUNTER — Ambulatory Visit
Admission: RE | Admit: 2023-07-03 | Discharge: 2023-07-03 | Disposition: A | Discharge status: Home / Self Care | Payer: 59 | Source: Ambulatory Visit | Attending: Orthopedic Surgery | Admitting: Orthopedic Surgery

## 2023-07-03 DIAGNOSIS — M4807 Spinal stenosis, lumbosacral region: Secondary | ICD-10-CM

## 2023-07-03 DIAGNOSIS — M545 Low back pain, unspecified: Secondary | ICD-10-CM | POA: Diagnosis not present

## 2023-07-29 ENCOUNTER — Ambulatory Visit: Payer: 59 | Admitting: Cardiology

## 2023-07-29 ENCOUNTER — Encounter: Payer: Self-pay | Admitting: Cardiology

## 2023-07-29 VITALS — BP 128/76 | HR 81 | Ht 70.0 in | Wt 220.0 lb

## 2023-07-29 DIAGNOSIS — Z1329 Encounter for screening for other suspected endocrine disorder: Secondary | ICD-10-CM | POA: Diagnosis not present

## 2023-07-29 DIAGNOSIS — Z131 Encounter for screening for diabetes mellitus: Secondary | ICD-10-CM

## 2023-07-29 DIAGNOSIS — M545 Low back pain, unspecified: Secondary | ICD-10-CM | POA: Diagnosis not present

## 2023-07-29 DIAGNOSIS — E782 Mixed hyperlipidemia: Secondary | ICD-10-CM | POA: Diagnosis not present

## 2023-07-29 DIAGNOSIS — E669 Obesity, unspecified: Secondary | ICD-10-CM

## 2023-07-29 DIAGNOSIS — Z0130 Encounter for examination of blood pressure without abnormal findings: Secondary | ICD-10-CM

## 2023-07-29 MED ORDER — METHOCARBAMOL 500 MG PO TABS: 500.0000 mg | ORAL_TABLET | Freq: Three times a day (TID) | ORAL | 1 refills | Status: AC | PRN

## 2023-07-29 MED ORDER — KETOROLAC TROMETHAMINE 10 MG PO TABS
10.0000 mg | ORAL_TABLET | Freq: Four times a day (QID) | ORAL | 1 refills | Status: AC | PRN
Start: 2023-07-29 — End: ?

## 2023-07-29 MED ORDER — ATORVASTATIN CALCIUM 10 MG PO TABS: 10.0000 mg | ORAL_TABLET | Freq: Every evening | ORAL | 0 refills | Status: AC

## 2023-07-29 NOTE — Progress Notes (Signed)
 Established Patient Office Visit  Subjective:  Patient ID: Evan Dixon, male    DOB: 26-Oct-1985  Age: 38 y.o. MRN: [id number]  Chief Complaint  Patient presents with   Follow-up    4 Month Follow Up    Patient in office for 4 month follow up. Patient reports feeling well. Complains of ongoing back pain. Recent MRI unremarkable. Will refer to PT.  Fasting lab work not done. Will return when fasting.     No other concerns at this time.   Past Medical History:  Diagnosis Date   Hyperlipemia     History reviewed. No pertinent surgical history.  Social History   Socioeconomic History   Marital status: Single    Spouse name: Not on file   Number of children: Not on file   Years of education: Not on file   Highest education level: Not on file  Occupational History   Not on file  Tobacco Use   Smoking status: Never   Smokeless tobacco: Not on file  Substance and Sexual Activity   Alcohol use: No   Drug use: No   Sexual activity: Not on file  Other Topics Concern   Not on file  Social History Narrative   Not on file   Social Drivers of Health   Financial Resource Strain: Low Risk  (09/17/2022)   Received from [place] and [place], [place] and Affiliates   Overall [employer] (CARDIA)    Difficulty of Paying Living Expenses: Not hard at all  Food Insecurity: No Food Insecurity (09/17/2022)   Received from [place] and [place], [place] and [employer] Vital Sign    Worried About Programme researcher, broadcasting/film/video in the Last Year: Never true    Ran Out of Food in the Last Year: Never true  Transportation Needs: No Transportation Needs (09/17/2022)   Received from [place] and [place], [place] and [employer] - Administrator, Civil Service (Medical): No    Lack of Transportation (Non-Medical): No  Physical Activity: Not on file  Stress: Not on file  Social Connections: Unknown (10/27/2021)   Received from [facility],  [facility]   Social Network    Social Network: Not on file  Intimate Partner Violence: Not At Risk (09/17/2022)   Received from [place] and [place], [place] and [employer], Afraid, Rape, and Kick questionnaire    Fear of Current or Ex-Partner: No    Emotionally Abused: No    Physically Abused: No    Sexually Abused: No    History reviewed. No pertinent family history.  Allergies  Allergen Reactions   Codeine Shortness Of Breath    Outpatient Medications Prior to Visit  Medication Sig   naproxen  (NAPROSYN ) 500 MG tablet Take 1 tablet (500 mg total) by mouth 2 (two) times daily with a meal.   traMADol  (ULTRAM ) 50 MG tablet Take 1 tablet (50 mg total) by mouth every 6 (six) hours as needed.   [DISCONTINUED] atorvastatin  (LIPITOR) 10 MG tablet Take 1 tablet (10 mg total) by mouth at bedtime.   [DISCONTINUED] methocarbamol  (ROBAXIN ) 500 MG tablet Take 1 tablet (500 mg total) by mouth every 8 (eight) hours as needed for muscle spasms.   No facility-administered medications prior to visit.    Review of Systems  Constitutional: Negative.   HENT: Negative.    Eyes: Negative.   Respiratory: Negative.  Negative for shortness of breath.   Cardiovascular: Negative.  Negative for chest  pain.  Gastrointestinal: Negative.  Negative for abdominal pain, constipation and diarrhea.  Genitourinary: Negative.   Musculoskeletal:  Positive for back pain. Negative for joint pain and myalgias.  Skin: Negative.   Neurological: Negative.  Negative for dizziness and headaches.  Endo/Heme/Allergies: Negative.   All other systems reviewed and are negative.      Objective:   BP 128/76   Pulse 81   Ht 5\' 10"  (1.778 m)   Wt 220 lb (99.8 kg)   SpO2 96%   BMI 31.57 kg/m   Vitals:   07/29/23 1416  BP: 128/76  Pulse: 81  Height: 5\' 10"  (1.778 m)  Weight: 220 lb (99.8 kg)  SpO2: 96%  BMI (Calculated): 31.57    Physical Exam Nursing note reviewed.  Constitutional:       Appearance: Normal appearance. He is normal weight.  HENT:     Head: Normocephalic and atraumatic.     Nose: Nose normal.     Mouth/Throat:     Mouth: Mucous membranes are moist.     Pharynx: Oropharynx is clear.  Eyes:     Extraocular Movements: Extraocular movements intact.     Conjunctiva/sclera: Conjunctivae normal.     Pupils: Pupils are equal, round, and reactive to light.  Cardiovascular:     Rate and Rhythm: Normal rate and regular rhythm.     Pulses: Normal pulses.     Heart sounds: Normal heart sounds.  Pulmonary:     Effort: Pulmonary effort is normal.     Breath sounds: Normal breath sounds.  Abdominal:     General: Abdomen is flat. Bowel sounds are normal.     Palpations: Abdomen is soft.  Musculoskeletal:        General: Normal range of motion.     Cervical back: Normal range of motion.  Skin:    General: Skin is warm and dry.  Neurological:     General: No focal deficit present.     Mental Status: He is alert and oriented to person, place, and time.  Psychiatric:        Mood and Affect: Mood normal.        Behavior: Behavior normal.        Thought Content: Thought content normal.        Judgment: Judgment normal.      No results found for any visits on 07/29/23.  Recent Results (from the past 2160 hours)  Urinalysis, Routine w reflex microscopic -Urine, Clean Catch     Status: Abnormal   Collection Time: 06/17/23  4:37 AM  Result Value Ref Range   Color, Urine YELLOW (A) YELLOW   APPearance CLEAR (A) CLEAR   Specific Gravity, Urine 1.017 1.005 - 1.030   pH 5.0 5.0 - 8.0   Glucose, UA NEGATIVE NEGATIVE mg/dL   Hgb urine dipstick MODERATE (A) NEGATIVE   Bilirubin Urine NEGATIVE NEGATIVE   Ketones, ur NEGATIVE NEGATIVE mg/dL   Protein, ur NEGATIVE NEGATIVE mg/dL   Nitrite NEGATIVE NEGATIVE   Leukocytes,Ua NEGATIVE NEGATIVE   RBC / HPF 0-5 0 - 5 RBC/hpf   WBC, UA 0-5 0 - 5 WBC/hpf   Bacteria, UA RARE (A) NONE SEEN   Squamous Epithelial / HPF 0-5 0  - 5 /HPF   Mucus PRESENT     Comment: Performed at [facility], [address], [place], [place] [address]  Basic metabolic panel     Status: Abnormal   Collection Time: 06/17/23  4:37 AM  Result Value  Ref Range   Sodium 135 135 - 145 mmol/L   Potassium 3.8 3.5 - 5.1 mmol/L   Chloride 102 98 - 111 mmol/L   CO2 22 22 - 32 mmol/L   Glucose, Bld 113 (H) 70 - 99 mg/dL    Comment: Glucose reference range applies only to samples taken after fasting for at least 8 hours.   BUN 13 6 - 20 mg/dL   Creatinine, Ser 1.61 0.61 - 1.24 mg/dL   Calcium  8.9 8.9 - 10.3 mg/dL   GFR, Estimated >09 >60 mL/min    Comment: (NOTE) Calculated using the CKD-EPI Creatinine Equation (2021)    Anion gap 11 5 - 15    Comment: Performed at [facility], [address] [place], [place] [address]  CBC     Status: None   Collection Time: 06/17/23  4:37 AM  Result Value Ref Range   WBC 7.2 4.0 - 10.5 K/uL   RBC 5.31 4.22 - 5.81 MIL/uL   Hemoglobin 15.1 13.0 - 17.0 g/dL   HCT 81.1 91.4 - 78.2 %   MCV 85.3 80.0 - 100.0 fL   MCH 28.4 26.0 - 34.0 pg   MCHC 33.3 30.0 - 36.0 g/dL   RDW 95.6 21.3 - 08.6 %   Platelets 290 150 - 400 K/uL   nRBC 0.0 0.0 - 0.2 %    Comment: Performed at [facility], [address], [place], [place] [address]      Assessment & Plan:  Return for fasting lab work. Referral sent to physical therapy.   Problem List Items Addressed This Visit       Other   Mixed hyperlipidemia - Primary   Relevant Medications   atorvastatin  (LIPITOR) 10 MG tablet   Other Relevant Orders   Lipid Profile   Obesity (BMI 30-39.9)   Other Visit Diagnoses       Diabetes mellitus screening       Relevant Orders   Hemoglobin A1c   CMP14+EGFR     Thyroid  disorder screening       Relevant Orders   TSH     Acute low back pain without sciatica, unspecified back pain laterality       Relevant Medications   methocarbamol  (ROBAXIN ) 500 MG tablet   ketorolac  (TORADOL ) 10  MG tablet   Other Relevant Orders   Ambulatory referral to Physical Therapy       Return in about 4 months (around 11/26/2023).   Total time spent: 25 minutes  [place], NP  07/29/2023   This document may have been prepared by Dragon Voice Recognition software and as such may include unintentional dictation errors.

## 2023-08-20 DIAGNOSIS — Z6832 Body mass index (BMI) 32.0-32.9, adult: Secondary | ICD-10-CM | POA: Diagnosis not present

## 2023-08-20 DIAGNOSIS — E669 Obesity, unspecified: Secondary | ICD-10-CM | POA: Diagnosis not present

## 2023-08-20 DIAGNOSIS — E785 Hyperlipidemia, unspecified: Secondary | ICD-10-CM | POA: Diagnosis not present

## 2023-08-26 DIAGNOSIS — M5186 Other intervertebral disc disorders, lumbar region: Secondary | ICD-10-CM | POA: Diagnosis not present

## 2023-09-02 DIAGNOSIS — M5186 Other intervertebral disc disorders, lumbar region: Secondary | ICD-10-CM | POA: Diagnosis not present

## 2023-11-25 ENCOUNTER — Ambulatory Visit: Payer: 59 | Admitting: Cardiology

## 2023-12-02 ENCOUNTER — Ambulatory Visit: Admitting: Cardiology

## 2023-12-09 ENCOUNTER — Ambulatory Visit: Admitting: Cardiology

## 2024-02-21 DIAGNOSIS — R0789 Other chest pain: Secondary | ICD-10-CM | POA: Diagnosis not present

## 2024-02-21 DIAGNOSIS — R079 Chest pain, unspecified: Secondary | ICD-10-CM | POA: Diagnosis not present

## 2024-04-01 ENCOUNTER — Encounter: Payer: Self-pay | Admitting: Cardiology

## 2024-04-01 ENCOUNTER — Ambulatory Visit: Admitting: Cardiology

## 2024-04-01 VITALS — BP 122/70 | HR 70 | Ht 70.0 in | Wt 217.8 lb

## 2024-04-01 DIAGNOSIS — Z1329 Encounter for screening for other suspected endocrine disorder: Secondary | ICD-10-CM | POA: Diagnosis not present

## 2024-04-01 DIAGNOSIS — Z0130 Encounter for examination of blood pressure without abnormal findings: Secondary | ICD-10-CM

## 2024-04-01 DIAGNOSIS — E782 Mixed hyperlipidemia: Secondary | ICD-10-CM

## 2024-04-01 DIAGNOSIS — Z131 Encounter for screening for diabetes mellitus: Secondary | ICD-10-CM | POA: Diagnosis not present

## 2024-04-01 MED ORDER — AZITHROMYCIN 500 MG PO TABS: 500.0000 mg | ORAL_TABLET | Freq: Every day | ORAL | 0 refills | Status: AC

## 2024-04-01 NOTE — Progress Notes (Signed)
 Established Patient Office Visit  Subjective:  Patient ID: Evan Dixon, male    DOB: 08-23-1985  Age: 38 y.o. MRN: [id number]  Chief Complaint  Patient presents with   Acute Visit    [place] traveled [place] has severe diarrhea, coughing,congestion, started 2 weeks ago. Possible gut bacteria. Did have a fever it has subsided.    Patient in office for an acute visit. [place] traveled [place] the [place], returned on 03/23/2024. Continues to have diarrhea. 7 times yesterday in one hour. Once so far today. Fever last week. Patient has not tried anti-diarrheal medication. Drinking plenty of water and electrolyte solution. Will send in azithromycin.   Diarrhea  This is a new problem. The current episode started 1 to 4 weeks ago. The problem occurs 5 to 10 times per day. The problem has been unchanged. Associated symptoms include coughing and a fever. Pertinent negatives include no abdominal pain, headaches or myalgias. Nothing aggravates the symptoms. Risk factors include travel to endemic area. He has tried electrolyte solution and increased fluids for the symptoms. The treatment provided no relief.  Cough This is a new problem. The current episode started 1 to 4 weeks ago. The problem has been unchanged. Associated symptoms include a fever and nasal congestion. Pertinent negatives include no chest pain, headaches, myalgias or shortness of breath. Treatments tried: Muccinex.    No other concerns at this time.   Past Medical History:  Diagnosis Date   Hyperlipemia     History reviewed. No pertinent surgical history.  Social History   Socioeconomic History   Marital status: Single    Spouse name: Not on file   Number of children: Not on file   Years of education: Not on file   Highest education level: Not on file  Occupational History   Not on file  Tobacco Use   Smoking status: Never   Smokeless tobacco: Not on file  Substance and Sexual Activity   Alcohol use: No   Drug use: No    Sexual activity: Not on file  Other Topics Concern   Not on file  Social History Narrative   Not on file   Social Drivers of Health   Financial Resource Strain: Low Risk  (09/17/2022)   Received from [facility]   Overall Financial Resource Strain (CARDIA)    Difficulty of Paying Living Expenses: Not hard at all  Food Insecurity: No Food Insecurity (09/17/2022)   Received from [facility] [employer] Vital Sign    Within the past 12 months, you worried that your food would run out before you got the money to buy more.: Never true    Within the past 12 months, the food you bought just didn't last and you didn't have money to get more.: Never true  Transportation Needs: No Transportation Needs (09/17/2022)   Received from [facility] [employer] - Administrator, Civil Service (Medical): No    Lack of Transportation (Non-Medical): No  Physical Activity: Not on file  Stress: Not on file  Social Connections: Unknown (10/27/2021)   Received from [facility]   Social Network    Social Network: Not on file  Intimate Partner Violence: Not At Risk (09/17/2022)   Received from [facility] [employer], Afraid, Rape, and Kick questionnaire    Within the last year, have you been afraid of your partner or ex-partner?: No    Within the last year, have you been  humiliated or emotionally abused in other ways by your partner or ex-partner?: No    Within the last year, have you been kicked, hit, slapped, or otherwise physically hurt by your partner or ex-partner?: No    Within the last year, have you been raped or forced to have any kind of sexual activity by your partner or ex-partner?: No    History reviewed. No pertinent family history.  Allergies  Allergen Reactions   Codeine Shortness Of Breath    Outpatient Medications Prior to Visit  Medication Sig   atorvastatin  (LIPITOR) 10 MG tablet Take 1 tablet (10 mg total) by mouth at bedtime.    ketorolac  (TORADOL ) 10 MG tablet Take 1 tablet (10 mg total) by mouth every 6 (six) hours as needed. (Patient not taking: Reported on 04/01/2024)   methocarbamol  (ROBAXIN ) 500 MG tablet Take 1 tablet (500 mg total) by mouth every 8 (eight) hours as needed for muscle spasms. (Patient not taking: Reported on 04/01/2024)   naproxen  (NAPROSYN ) 500 MG tablet Take 1 tablet (500 mg total) by mouth 2 (two) times daily with a meal. (Patient not taking: Reported on 04/01/2024)   traMADol  (ULTRAM ) 50 MG tablet Take 1 tablet (50 mg total) by mouth every 6 (six) hours as needed. (Patient not taking: Reported on 04/01/2024)   No facility-administered medications prior to visit.    Review of Systems  Constitutional:  Positive for fever.  HENT:  Positive for congestion.   Eyes: Negative.   Respiratory:  Positive for cough. Negative for shortness of breath.   Cardiovascular: Negative.  Negative for chest pain.  Gastrointestinal:  Positive for diarrhea. Negative for abdominal pain and constipation.  Genitourinary: Negative.   Musculoskeletal:  Negative for joint pain and myalgias.  Skin: Negative.   Neurological: Negative.  Negative for dizziness and headaches.  Endo/Heme/Allergies: Negative.   All other systems reviewed and are negative.      Objective:   BP 122/70   Pulse 70   Ht 5' 10 (1.778 m)   Wt 217 lb 12.8 oz (98.8 kg)   SpO2 96%   BMI 31.25 kg/m   Vitals:   04/01/24 1109  BP: 122/70  Pulse: 70  Height: 5' 10 (1.778 m)  Weight: 217 lb 12.8 oz (98.8 kg)  SpO2: 96%  BMI (Calculated): 31.25    Physical Exam Nursing note reviewed.  Constitutional:      Appearance: Normal appearance. He is normal weight.  HENT:     Head: Normocephalic and atraumatic.     Nose: Nose normal.     Mouth/Throat:     Mouth: Mucous membranes are moist.     Pharynx: Oropharynx is clear.  Eyes:     Extraocular Movements: Extraocular movements intact.     Conjunctiva/sclera: Conjunctivae normal.      Pupils: Pupils are equal, round, and reactive to light.  Cardiovascular:     Rate and Rhythm: Normal rate and regular rhythm.     Pulses: Normal pulses.     Heart sounds: Normal heart sounds.  Pulmonary:     Effort: Pulmonary effort is normal.     Breath sounds: Normal breath sounds.  Abdominal:     General: Abdomen is flat. Bowel sounds are normal.     Palpations: Abdomen is soft.  Musculoskeletal:        General: Normal range of motion.     Cervical back: Normal range of motion.  Skin:    General: Skin is warm and dry.  Neurological:  General: No focal deficit present.     Mental Status: He is alert and oriented to person, place, and time.  Psychiatric:        Mood and Affect: Mood normal.        Behavior: Behavior normal.        Thought Content: Thought content normal.        Judgment: Judgment normal.      No results found for any visits on 04/01/24.  No results found for this or any previous visit (from the past 2160 hours).    Assessment & Plan:  Azithromycin Fasting lab work today Drink plenty of water  Problem List Items Addressed This Visit       Other   Mixed hyperlipidemia - Primary   Relevant Orders   Lipid Profile   CBC with Differential/Platelet   Other Visit Diagnoses       Diabetes mellitus screening       Relevant Orders   CMP14+EGFR   Hemoglobin A1c   CBC with Differential/Platelet     Thyroid  disorder screening       Relevant Orders   CBC with Differential/Platelet   TSH       Return in about 4 weeks (around 04/29/2024) for fasting lab work prior.   Total time spent: 25 minutes  [employer], NP  04/01/2024   This document may have been prepared by Dragon Voice Recognition software and as such may include unintentional dictation errors.

## 2024-04-02 ENCOUNTER — Ambulatory Visit: Payer: Self-pay | Admitting: Cardiology

## 2024-04-02 LAB — LIPID PANEL
Chol/HDL Ratio: 6.8 ratio — ABNORMAL HIGH (ref 0.0–5.0)
Cholesterol, Total: 253 mg/dL — ABNORMAL HIGH (ref 100–199)
HDL: 37 mg/dL — ABNORMAL LOW (ref >39)
LDL Chol Calc (NIH): 167 mg/dL — ABNORMAL HIGH (ref 0–99)
Triglycerides: 261 mg/dL — ABNORMAL HIGH (ref 0–149)
VLDL Cholesterol Cal: 49 mg/dL — ABNORMAL HIGH (ref 5–40)

## 2024-04-02 LAB — CMP14+EGFR
ALT: 62 IU/L — ABNORMAL HIGH (ref 0–44)
AST: 31 IU/L (ref 0–40)
Albumin: 4.6 g/dL (ref 4.1–5.1)
Alkaline Phosphatase: 118 IU/L (ref 47–123)
BUN/Creatinine Ratio: 14 (ref 9–20)
BUN: 15 mg/dL (ref 6–20)
Bilirubin Total: 0.3 mg/dL (ref 0.0–1.2)
CO2: 22 mmol/L (ref 20–29)
Calcium: 9.8 mg/dL (ref 8.7–10.2)
Chloride: 98 mmol/L (ref 96–106)
Creatinine, Ser: 1.08 mg/dL (ref 0.76–1.27)
Globulin, Total: 3.1 g/dL (ref 1.5–4.5)
Glucose: 87 mg/dL (ref 70–99)
Potassium: 4.4 mmol/L (ref 3.5–5.2)
Sodium: 136 mmol/L (ref 134–144)
Total Protein: 7.7 g/dL (ref 6.0–8.5)
eGFR: 91 mL/min/1.73 (ref >59)

## 2024-04-02 LAB — CBC WITH DIFFERENTIAL/PLATELET
Basophils Absolute: 0.1 x10E3/uL (ref 0.0–0.2)
Basos: 1 %
EOS (ABSOLUTE): 0.1 x10E3/uL (ref 0.0–0.4)
Eos: 1 %
Hematocrit: 48.1 % (ref 37.5–51.0)
Hemoglobin: 16 g/dL (ref 13.0–17.7)
Immature Grans (Abs): 0 x10E3/uL (ref 0.0–0.1)
Immature Granulocytes: 0 %
Lymphocytes Absolute: 2.2 x10E3/uL (ref 0.7–3.1)
Lymphs: 29 %
MCH: 29.6 pg (ref 26.6–33.0)
MCHC: 33.3 g/dL (ref 31.5–35.7)
MCV: 89 fL (ref 79–97)
Monocytes Absolute: 0.4 x10E3/uL (ref 0.1–0.9)
Monocytes: 5 %
Neutrophils Absolute: 4.7 x10E3/uL (ref 1.4–7.0)
Neutrophils: 64 %
Platelets: 386 x10E3/uL (ref 150–450)
RBC: 5.4 x10E6/uL (ref 4.14–5.80)
RDW: 13 % (ref 11.6–15.4)
WBC: 7.4 x10E3/uL (ref 3.4–10.8)

## 2024-04-02 LAB — TSH: TSH: 1.6 u[IU]/mL (ref 0.450–4.500)

## 2024-04-02 LAB — HEMOGLOBIN A1C
Est. average glucose Bld gHb Est-mCnc: 111 mg/dL
Hgb A1c MFr Bld: 5.5 % (ref 4.8–5.6)

## 2024-04-27 ENCOUNTER — Ambulatory Visit: Admitting: Cardiology

## 2024-06-12 ENCOUNTER — Encounter: Payer: Self-pay | Admitting: Internal Medicine

## 2024-06-12 ENCOUNTER — Ambulatory Visit (INDEPENDENT_AMBULATORY_CARE_PROVIDER_SITE_OTHER): Admitting: Internal Medicine

## 2024-06-12 ENCOUNTER — Ambulatory Visit: Payer: Self-pay | Admitting: Internal Medicine

## 2024-06-12 DIAGNOSIS — J069 Acute upper respiratory infection, unspecified: Secondary | ICD-10-CM | POA: Diagnosis not present

## 2024-06-12 LAB — POCT XPERT XPRESS SARS COVID-2/FLU/RSV
FLU A: NEGATIVE
FLU B: NEGATIVE
RSV RNA, PCR: NEGATIVE
SARS Coronavirus 2: NEGATIVE

## 2024-06-12 NOTE — Progress Notes (Signed)
URI symptoms.

## 2024-06-12 NOTE — Progress Notes (Signed)
 Patient notified
